# Patient Record
Sex: Male | Born: 2005 | Race: Black or African American | Hispanic: No | Marital: Single | Smoking: Never smoker
Health system: Southern US, Community
[De-identification: ages and names within clinical notes are randomized; demographics above are authoritative.]

## PROBLEM LIST (undated history)

## (undated) ENCOUNTER — Emergency Department (HOSPITAL_COMMUNITY): Payer: Medicaid Other

## (undated) DIAGNOSIS — F909 Attention-deficit hyperactivity disorder, unspecified type: Secondary | ICD-10-CM

## (undated) HISTORY — PX: TONSILLECTOMY: SUR1361

## (undated) HISTORY — PX: NO PAST SURGERIES: SHX2092

## (undated) HISTORY — PX: ADENOIDECTOMY: SUR15

---
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
## 2023-01-12 DIAGNOSIS — Z13220 Encounter for screening for lipoid disorders: Secondary | ICD-10-CM | POA: Diagnosis not present

## 2023-01-12 DIAGNOSIS — Z0000 Encounter for general adult medical examination without abnormal findings: Secondary | ICD-10-CM | POA: Diagnosis not present

## 2023-07-19 ENCOUNTER — Ambulatory Visit (HOSPITAL_COMMUNITY)
Admission: EM | Admit: 2023-07-19 | Discharge: 2023-07-19 | Disposition: A | Discharge status: Home / Self Care | Attending: Emergency Medicine | Admitting: Emergency Medicine

## 2023-07-19 ENCOUNTER — Encounter (HOSPITAL_COMMUNITY): Payer: Self-pay

## 2023-07-19 DIAGNOSIS — S0501XA Injury of conjunctiva and corneal abrasion without foreign body, right eye, initial encounter: Secondary | ICD-10-CM

## 2023-07-19 LAB — POCT FASTING CBG KUC MANUAL ENTRY: POCT Glucose (KUC): 94 mg/dL (ref 70–99)

## 2023-07-19 MED ORDER — TETRACAINE HCL 0.5 % OP SOLN
OPHTHALMIC | Status: AC
  Filled 2023-07-19: qty 4

## 2023-07-19 MED ORDER — ERYTHROMYCIN 5 MG/GM OP OINT: TOPICAL_OINTMENT | OPHTHALMIC | 0 refills | Status: AC

## 2023-07-19 MED ORDER — FLUORESCEIN SODIUM 1 MG OP STRP
ORAL_STRIP | OPHTHALMIC | Status: AC
  Filled 2023-07-19: qty 1

## 2023-07-19 NOTE — ED Triage Notes (Signed)
 Patient here today with c/o right eye pain after a dog scratching him in the eye 2 days ago. Patient states that he has some visual changes. Mom is also concerned with his glucose levels he has had higher glucose levels in the past.

## 2023-07-19 NOTE — ED Provider Notes (Signed)
 [facility]    CSN: [id number] Arrival date & time: 07/19/23  1707      History   Chief Complaint Chief Complaint  Patient presents with   Eye Pain    HPI Seth Farmer is a 18 y.o. male.   Patient presents to clinic with mother for concerns of eye pain after he got poked in the eye by his dogs paw 2 days ago.  Yesterday his eye was a lot more swollen and red.  He presents to clinic today over concern of eye redness and pain.  Has had some visual changes, no vision loss.  Discomfort with blinking.  Mother would also like his glucose levels checked as he has had prediabetic A1c in the past.  He is not fasting.  The history is provided by the patient, medical records and a parent.  Eye Pain    Past Medical History:  Diagnosis Date   ADHD     Patient Active Problem List   Diagnosis Date Noted   Postural low back pain 07/30/2020   Seasonal and perennial allergic rhinitis 11/12/2017   Adverse food reaction 11/12/2017    Past Surgical History:  Procedure Laterality Date   ADENOIDECTOMY     DENTAL SURGERY  08/09/2019   NO PAST SURGERIES     TONSILLECTOMY         Home Medications    Prior to Admission medications   Medication Sig Start Date End Date Taking? Authorizing Provider  erythromycin ophthalmic ointment Place a 1/2 inch ribbon of ointment into the right lower eyelid 4x daily for 5 days 07/19/23  Yes Rinaldo Ratel, [place] N, FNP  cetirizine (ZYRTEC) 10 MG tablet Take 1 tablet (10 mg total) by mouth daily. 10/03/21   Alfonse Spruce, MD  EPINEPHrine (EPIPEN 2-PAK) 0.3 mg/0.3 mL IJ SOAJ injection Inject 0.3 mg into the muscle as needed for anaphylaxis. 10/03/21   Alfonse Spruce, MD  fluticasone [facility]) 50 MCG/ACT nasal spray Place 2 sprays into both nostrils daily. 10/03/21   Alfonse Spruce, MD  montelukast (SINGULAIR) 10 MG tablet Take 1 tablet (10 mg total) by mouth at bedtime. 10/03/21   Alfonse Spruce, MD    Family  History Family History  Problem Relation Age of Onset   Allergic rhinitis Mother    Allergic rhinitis Brother     Social History Social History   Tobacco Use   Smoking status: Never   Smokeless tobacco: Never  Vaping Use   Vaping status: Never Used  Substance Use Topics   Alcohol use: No   Drug use: Never     Allergies   Patient has no known allergies.   Review of Systems Review of Systems  Per HPI  Physical Exam Triage Vital Signs ED Triage Vitals  Encounter Vitals Group     BP 07/19/23 1811 126/76     Systolic BP Percentile --      Diastolic BP Percentile --      Pulse Rate 07/19/23 1811 66     Resp 07/19/23 1811 16     Temp 07/19/23 1811 98.7 F (37.1 C)     Temp Source 07/19/23 1811 Oral     SpO2 07/19/23 1811 98 %     Weight 07/19/23 1811 150 lb (68 kg)     Height 07/19/23 1811 5\' 8"  (1.727 m)     Head Circumference --      Peak Flow --      Pain Score 07/19/23 1812  8     Pain Loc --      Pain Education --      Exclude from Growth Chart --    No data found.  Updated Vital Signs BP 126/76 (BP Location: Left Arm)   Pulse 66   Temp 98.7 F (37.1 C) (Oral)   Resp 16   Ht 5\' 8"  (1.727 m)   Wt 150 lb (68 kg)   SpO2 98%   BMI 22.81 kg/m   Visual Acuity Right Eye Distance: 20/40 Left Eye Distance: 20/40 Bilateral Distance: 20/25  Right Eye Near:   Left Eye Near:    Bilateral Near:     Physical Exam Vitals and nursing note reviewed.  Constitutional:      Appearance: Normal appearance.  HENT:     Head: Normocephalic and atraumatic.     Right Ear: External ear normal.     Left Ear: External ear normal.     Nose: Nose normal.     Mouth/Throat:     Mouth: Mucous membranes are moist.  Eyes:     General: Lids are normal. Lids are everted, no foreign bodies appreciated. Vision grossly intact. Gaze aligned appropriately.     Extraocular Movements: Extraocular movements intact.     Pupils: Pupils are equal, round, and reactive to light.      Right eye: Corneal abrasion present.      Comments: Corneal abrasion to the right lower cornea noted on fluorescein stain  Cardiovascular:     Rate and Rhythm: Normal rate.  Pulmonary:     Effort: Pulmonary effort is normal. No respiratory distress.  Musculoskeletal:        General: Normal range of motion.  Skin:    General: Skin is warm and dry.  Neurological:     General: No focal deficit present.     Mental Status: He is alert.  Psychiatric:        Mood and Affect: Mood normal.        Behavior: Behavior is cooperative.      UC Treatments / Results  Labs (all labs ordered are listed, but only abnormal results are displayed) Labs Reviewed  POCT FASTING CBG [facility] MANUAL ENTRY    EKG   Radiology No results found.  Procedures Procedures (including critical care time)  Medications Ordered in UC Medications - No data to display  Initial Impression / Assessment and Plan / UC Course  I have reviewed the triage vital signs and the nursing notes.  Pertinent labs & imaging results that were available during my care of the patient were reviewed by me and considered in my medical decision making (see chart for details).  Vitals and triage reviewed, patient is hemodynamically stable.  Corneal abrasion to right lower cornea on fluorescein stain.  Vision grossly intact.  Will send in erythromycin ointment to help prevent against bacterial infection due to injury with dog paw.  Mother requested CBG, this was normal.  Plan of care, follow-up care return precautions given, no questions at this time.     Final Clinical Impressions(s) / UC Diagnoses   Final diagnoses:  Abrasion of right cornea, initial encounter     Discharge Instructions      You can numbing drops every 30 minutes to an hour for the rest of tonight.  Discard them when you go to bed.  Use the erythromycin 4 times daily for the next 5 days to help prevent bacterial infection.  This will also coat and soothe the  eye.  His blood sugar was 94, this is normal.  For any pain you can take 800 mg of ibuprofen every 8 hours as needed.     ED Prescriptions     Medication Sig Dispense Auth. Provider   erythromycin ophthalmic ointment Place a 1/2 inch ribbon of ointment into the right lower eyelid 4x daily for 5 days 3.5 g Shaterria Sager, [place], [place]      PDMP not reviewed this encounter.   Waneda Klammer, [place] N, [place] 07/19/23 [phone]

## 2023-07-19 NOTE — Discharge Instructions (Addendum)
 You can numbing drops every 30 minutes to an hour for the rest of tonight.  Discard them when you go to bed.  Use the erythromycin 4 times daily for the next 5 days to help prevent bacterial infection.  This will also coat and soothe the eye.  His blood sugar was 94, this is normal.  For any pain you can take 800 mg of ibuprofen every 8 hours as needed.

## 2023-08-27 ENCOUNTER — Encounter: Payer: Self-pay | Admitting: Family Medicine

## 2023-08-27 ENCOUNTER — Ambulatory Visit (INDEPENDENT_AMBULATORY_CARE_PROVIDER_SITE_OTHER): Admitting: Family Medicine

## 2023-08-27 VITALS — BP 100/60 | HR 77 | Temp 98.4°F | Resp 12 | Ht 66.93 in | Wt 148.1 lb

## 2023-08-27 DIAGNOSIS — H1010 Acute atopic conjunctivitis, unspecified eye: Secondary | ICD-10-CM | POA: Insufficient documentation

## 2023-08-27 DIAGNOSIS — H1013 Acute atopic conjunctivitis, bilateral: Secondary | ICD-10-CM

## 2023-08-27 DIAGNOSIS — T7800XD Anaphylactic reaction due to unspecified food, subsequent encounter: Secondary | ICD-10-CM

## 2023-08-27 DIAGNOSIS — J3089 Other allergic rhinitis: Secondary | ICD-10-CM | POA: Diagnosis not present

## 2023-08-27 DIAGNOSIS — J302 Other seasonal allergic rhinitis: Secondary | ICD-10-CM

## 2023-08-27 DIAGNOSIS — T7800XA Anaphylactic reaction due to unspecified food, initial encounter: Secondary | ICD-10-CM | POA: Insufficient documentation

## 2023-08-27 MED ORDER — FLUTICASONE PROPIONATE 50 MCG/ACT NA SUSP: 2.0000 | Freq: Every day | NASAL | 5 refills | Status: AC | PRN

## 2023-08-27 MED ORDER — EPINEPHRINE 0.3 MG/0.3ML IJ SOAJ: 0.3000 mg | INTRAMUSCULAR | 2 refills | Status: AC | PRN

## 2023-08-27 MED ORDER — CROMOLYN SODIUM 4 % OP SOLN: 2.0000 [drp] | Freq: Four times a day (QID) | OPHTHALMIC | 5 refills | Status: AC | PRN

## 2023-08-27 MED ORDER — MONTELUKAST SODIUM 10 MG PO TABS: 10.0000 mg | ORAL_TABLET | Freq: Every day | ORAL | 11 refills | Status: AC

## 2023-08-27 MED ORDER — CETIRIZINE HCL 10 MG PO TABS: 10.0000 mg | ORAL_TABLET | Freq: Every day | ORAL | 11 refills | Status: AC

## 2023-08-27 NOTE — Progress Notes (Signed)
 [address] [place] [address] Dept: [phone]  FOLLOW UP NOTE  Patient ID: Seth Farmer, male    DOB: 18-Oct-2005  Age: 18 y.o. MRN: [id number] Date of Office Visit: 08/27/2023  Assessment  Chief Complaint: Medication Refill  HPI Seth Farmer is a 18 year old male who presents to the clinic for a follow-up visit.  He was last seen in this clinic on 12/05/2021 by Dr. 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  He was able to pass a peanut challenge on 12/05/2021.   At today's visit, he reports his allergic rhinitis has been poorly controlled with symptoms including clear rhinorrhea, sneezing, postnasal drainage, and nasal congestion.  He is not currently taking an antihistamine or using a steroid nasal spray.  He has previously taken montelukast without adverse effects, however, he is currently out of this medication.  His last environmental allergy skin testing was on 11/12/2017 was positive to tree pollen, weed pollen, grass pollen, mold, dust mite, and cat.    Allergic conjunctivitis is reported as poorly controlled with symptoms including red and itchy eyes for which he is not currently using an allergy eyedrop or lubricating eyedrop.   He continues to avoid tree nuts, watermelon and carrots with no accidental ingestion or EpiPen use since his last visit to this clinic.  His EpiPen is currently out of date and will be reordered at today's visit.  He reports that he is currently eating peanuts, wheat, soy, and milk without adverse reaction.  His current medications are listed in the chart.  Drug Allergies:  No Known Allergies  Physical Exam: BP 100/60   Pulse 77   Temp 98.4 F (36.9 C)   Resp 12   Ht 5' 6.93" (1.7 m)   Wt 148 lb 1.6 oz (67.2 kg)   SpO2 98%   BMI 23.24 kg/m    Physical Exam Vitals reviewed.  Constitutional:       Appearance: Normal appearance.  HENT:     Head: Normocephalic and atraumatic.     Right Ear: Tympanic membrane normal.     Left Ear: Tympanic membrane normal.     Nose:     Comments: Bilateral nares edematous and pale with thin clear nasal drainage noted.  Pharynx normal.  Ears normal.  Eyes normal.    Mouth/Throat:     Pharynx: Oropharynx is clear.  Eyes:     Conjunctiva/sclera: Conjunctivae normal.  Cardiovascular:     Rate and Rhythm: Normal rate and regular rhythm.     Heart sounds: Normal heart sounds. No murmur heard. Pulmonary:     Effort: Pulmonary effort is normal.     Breath sounds: Normal breath sounds.     Comments: Lungs clear to auscultation Musculoskeletal:     Cervical back: Normal range of motion and neck supple.  Skin:    General: Skin is warm and dry.  Neurological:     Mental Status: He is alert and oriented to person, place, and time.  Psychiatric:        Mood and Affect: Mood normal.        Behavior: Behavior normal.        Thought Content: Thought content normal.        Judgment: Judgment normal.     Assessment and Plan: 1. Anaphylactic shock due to food, subsequent encounter   2. Seasonal allergic conjunctivitis  3. Seasonal and perennial allergic rhinitis     Meds ordered this encounter  Medications   cetirizine (ZYRTEC) 10 MG tablet    Sig: Take 1 tablet (10 mg total) by mouth daily.    Dispense:  30 tablet    Refill:  11   EPINEPHrine (EPIPEN 2-PAK) 0.3 mg/0.3 mL IJ SOAJ injection    Sig: Inject 0.3 mg into the muscle as needed for anaphylaxis.    Dispense:  2 each    Refill:  2   montelukast (SINGULAIR) 10 MG tablet    Sig: Take 1 tablet (10 mg total) by mouth at bedtime.    Dispense:  30 tablet    Refill:  11   fluticasone (FLONASE) 50 MCG/ACT nasal spray    Sig: Place 2 sprays into both nostrils daily as needed for allergies or rhinitis.    Dispense:  16 g    Refill:  5   cromolyn (OPTICROM) 4 % ophthalmic solution    Sig:  Place 2 drops into both eyes 4 (four) times daily as needed.    Dispense:  10 mL    Refill:  5    Patient Instructions  Allergic rhinitis Continue allergen avoidance measures directed toward grass pollen, weed pollen, tree pollen, mold, dust mite, and the cat as listed below Begin cetirizine 10 mg once a day if needed for runny nose or itch Restart montelukast 10 mg once a day to prevent allergy symptoms continue Xyzal 5 mg once a day if needed for runny nose or itch Begin Flonase 2 sprays in each nostril once a day if needed for a stuffy nose Consider saline nasal rinses as needed for nasal symptoms. Use this before any medicated nasal sprays for best result Consider allergen immunotherapy if your symptoms are well-controlled with the treatment plan as listed above Consider updating your environmental allergy testing.  Remember to stop antihistamines for 3 days before your testing appointment.  Allergic conjunctivitis Begin cromolyn 2 drops in each eye up to 4 times a day if needed for red or itchy eyes  Food allergy Continue to avoid tree nuts, watermelon, and carrot in case of an allergic reaction, take Benadryl 50 mg every 4 hours, and if life-threatening symptoms occur, inject with EpiPen 0.3 mg.   Consider updating your food allergy skin testing.  Remember to stop antihistamines for 3 days before your skin testing appointment  Call the clinic if this treatment plan is not working well for you.  Follow up in 6 months or sooner if needed.   Return in about 6 months (around 02/26/2024), or if symptoms worsen or fail to improve.    Thank you for the opportunity to care for this patient.  Please do not hesitate to contact me with questions.  Thermon Leyland, FNP [facility]

## 2023-08-27 NOTE — Patient Instructions (Addendum)
 Allergic rhinitis Continue allergen avoidance measures directed toward grass pollen, weed pollen, tree pollen, mold, dust mite, and the cat as listed below Begin cetirizine 10 mg once a day if needed for runny nose or itch Restart montelukast 10 mg once a day to prevent allergy symptoms continue Xyzal 5 mg once a day if needed for runny nose or itch Begin Flonase 2 sprays in each nostril once a day if needed for a stuffy nose Consider saline nasal rinses as needed for nasal symptoms. Use this before any medicated nasal sprays for best result Consider allergen immunotherapy if your symptoms are well-controlled with the treatment plan as listed above Consider updating your environmental allergy testing.  Remember to stop antihistamines for 3 days before your testing appointment.  Allergic conjunctivitis Begin cromolyn 2 drops in each eye up to 4 times a day if needed for red or itchy eyes  Food allergy Continue to avoid tree nuts, watermelon, and carrot in case of an allergic reaction, take Benadryl 50 mg every 4 hours, and if life-threatening symptoms occur, inject with EpiPen 0.3 mg.   Consider updating your food allergy skin testing.  Remember to stop antihistamines for 3 days before your skin testing appointment  Call the clinic if this treatment plan is not working well for you.  Follow up in 6 months or sooner if needed.  Reducing Pollen Exposure The American Academy of Allergy, Asthma and Immunology suggests the following steps to reduce your exposure to pollen during allergy seasons. Do not hang sheets or clothing out to dry; pollen may collect on these items. Do not mow lawns or spend time around freshly cut grass; mowing stirs up pollen. Keep windows closed at night.  Keep car windows closed while driving. Minimize morning activities outdoors, a time when pollen counts are usually at their highest. Stay indoors as much as possible when pollen counts or humidity is high and on windy  days when pollen tends to remain in the air longer. Use air conditioning when possible.  Many air conditioners have filters that trap the pollen spores. Use a HEPA room air filter to remove pollen form the indoor air you breathe.  Control of Mold Allergen Mold and fungi can grow on a variety of surfaces provided certain temperature and moisture conditions exist.  Outdoor molds grow on plants, decaying vegetation and soil.  The major outdoor mold, Alternaria and Cladosporium, are found in very high numbers during hot and dry conditions.  Generally, a late Summer - Fall peak is seen for common outdoor fungal spores.  Rain will temporarily lower outdoor mold spore count, but counts rise rapidly when the rainy period ends.  The most important indoor molds are Aspergillus and Penicillium.  Dark, humid and poorly ventilated basements are ideal sites for mold growth.  The next most common sites of mold growth are the bathroom and the kitchen.  Outdoor [employer] Use air conditioning and keep windows closed Avoid exposure to decaying vegetation. Avoid leaf raking. Avoid grain handling. Consider wearing a face mask if working in moldy areas.  Indoor Mold Control Maintain humidity below 50%. Clean washable surfaces with 5% bleach solution. Remove sources e.g. Contaminated carpets.   Control of Dust Mite Allergen Dust mites play a major role in allergic asthma and rhinitis. They occur in environments with high humidity wherever human skin is found. Dust mites absorb humidity from the atmosphere (ie, they do not drink) and feed on organic matter (including shed human and animal skin). Dust  mites are a microscopic type of insect that you cannot see with the naked eye. High levels of dust mites have been detected from mattresses, pillows, carpets, upholstered furniture, bed covers, clothes, soft toys and any woven material. The principal allergen of the dust mite is found in its feces. A gram of dust may  contain 1,000 mites and 250,000 fecal particles. Mite antigen is easily measured in the air during house cleaning activities. Dust mites do not bite and do not cause harm to humans, other than by triggering allergies/asthma.  Ways to decrease your exposure to dust mites in your home:  1. Encase mattresses, box springs and pillows with a mite-impermeable barrier or cover  2. Wash sheets, blankets and drapes weekly in hot water (130 F) with detergent and dry them in a dryer on the hot setting.  3. Have the room cleaned frequently with a vacuum cleaner and a damp dust-mop. For carpeting or rugs, vacuuming with a vacuum cleaner equipped with a high-efficiency particulate air (HEPA) filter. The dust mite allergic individual should not be in a room which is being cleaned and should wait 1 hour after cleaning before going into the room.  4. Do not sleep on upholstered furniture (eg, couches).  5. If possible removing carpeting, upholstered furniture and drapery from the home is ideal. Horizontal blinds should be eliminated in the rooms where the person spends the most time (bedroom, study, television room). Washable vinyl, roller-type shades are optimal.  6. Remove all non-washable stuffed toys from the bedroom. Wash stuffed toys weekly like sheets and blankets above.  7. Reduce indoor humidity to less than 50%. Inexpensive humidity monitors can be purchased at most hardware stores. Do not use a humidifier as can make the problem worse and are not recommended.  Control of Dog or Cat Allergen Avoidance is the best way to manage a dog or cat allergy. If you have a dog or cat and are allergic to dog or cats, consider removing the dog or cat from the home. If you have a dog or cat but don't want to find it a new home, or if your family wants a pet even though someone in the household is allergic, here are some strategies that may help keep symptoms at bay:  Keep the pet out of your bedroom and restrict  it to only a few rooms. Be advised that keeping the dog or cat in only one room will not limit the allergens to that room. Don't pet, hug or kiss the dog or cat; if you do, wash your hands with soap and water. High-efficiency particulate air (HEPA) cleaners run continuously in a bedroom or living room can reduce allergen levels over time. Regular use of a high-efficiency vacuum cleaner or a central vacuum can reduce allergen levels. Giving your dog or cat a bath at least once a week can reduce airborne allergen.

## 2023-09-22 NOTE — Patient Instructions (Incomplete)
 Allergic rhinitis Your skin testing Continue allergen avoidance measures directed toward grass pollen, weed pollen, tree pollen, mold, dust mite, and the cat as listed below Begin cetirizine  10 mg once a day if needed for runny nose or itch Restart montelukast  10 mg once a day to prevent allergy symptoms continue Xyzal 5 mg once a day if needed for runny nose or itch Begin Flonase  2 sprays in each nostril once a day if needed for a stuffy nose Consider saline nasal rinses as needed for nasal symptoms. Use this before any medicated nasal sprays for best result Consider allergen immunotherapy if your symptoms are well-controlled with the treatment plan as listed above Consider updating your environmental allergy testing.  Remember to stop antihistamines for 3 days before your testing appointment.  Allergic conjunctivitis Begin cromolyn  2 drops in each eye up to 4 times a day if needed for red or itchy eyes  Food allergy Continue to avoid tree nuts, watermelon, and carrot in case of an allergic reaction, take Benadryl 50 mg every 4 hours, and if life-threatening symptoms occur, inject with EpiPen  0.3 mg.   Consider updating your food allergy skin testing.  Remember to stop antihistamines for 3 days before your skin testing appointment  Call the clinic if this treatment plan is not working well for you.  Follow up in 6 months or sooner if needed.  Reducing Pollen Exposure The American Academy of Allergy, Asthma and Immunology suggests the following steps to reduce your exposure to pollen during allergy seasons. Do not hang sheets or clothing out to dry; pollen may collect on these items. Do not mow lawns or spend time around freshly cut grass; mowing stirs up pollen. Keep windows closed at night.  Keep car windows closed while driving. Minimize morning activities outdoors, a time when pollen counts are usually at their highest. Stay indoors as much as possible when pollen counts or humidity is  high and on windy days when pollen tends to remain in the air longer. Use air conditioning when possible.  Many air conditioners have filters that trap the pollen spores. Use a HEPA room air filter to remove pollen form the indoor air you breathe.  Control of Mold Allergen Mold and fungi can grow on a variety of surfaces provided certain temperature and moisture conditions exist.  Outdoor molds grow on plants, decaying vegetation and soil.  The major outdoor mold, Alternaria and Cladosporium, are found in very high numbers during hot and dry conditions.  Generally, a late Summer - Fall peak is seen for common outdoor fungal spores.  Rain will temporarily lower outdoor mold spore count, but counts rise rapidly when the rainy period ends.  The most important indoor molds are Aspergillus and Penicillium.  Dark, humid and poorly ventilated basements are ideal sites for mold growth.  The next most common sites of mold growth are the bathroom and the kitchen.  Outdoor [employer] Use air conditioning and keep windows closed Avoid exposure to decaying vegetation. Avoid leaf raking. Avoid grain handling. Consider wearing a face mask if working in moldy areas.  Indoor Mold Control Maintain humidity below 50%. Clean washable surfaces with 5% bleach solution. Remove sources e.g. Contaminated carpets.   Control of Dust Mite Allergen Dust mites play a major role in allergic asthma and rhinitis. They occur in environments with high humidity wherever human skin is found. Dust mites absorb humidity from the atmosphere (ie, they do not drink) and feed on organic matter (including shed human and  animal skin). Dust mites are a microscopic type of insect that you cannot see with the naked eye. High levels of dust mites have been detected from mattresses, pillows, carpets, upholstered furniture, bed covers, clothes, soft toys and any woven material. The principal allergen of the dust mite is found in its feces. A  gram of dust may contain 1,000 mites and 250,000 fecal particles. Mite antigen is easily measured in the air during house cleaning activities. Dust mites do not bite and do not cause harm to humans, other than by triggering allergies/asthma.  Ways to decrease your exposure to dust mites in your home:  1. Encase mattresses, box springs and pillows with a mite-impermeable barrier or cover  2. Wash sheets, blankets and drapes weekly in hot water (130 F) with detergent and dry them in a dryer on the hot setting.  3. Have the room cleaned frequently with a vacuum cleaner and a damp dust-mop. For carpeting or rugs, vacuuming with a vacuum cleaner equipped with a high-efficiency particulate air (HEPA) filter. The dust mite allergic individual should not be in a room which is being cleaned and should wait 1 hour after cleaning before going into the room.  4. Do not sleep on upholstered furniture (eg, couches).  5. If possible removing carpeting, upholstered furniture and drapery from the home is ideal. Horizontal blinds should be eliminated in the rooms where the person spends the most time (bedroom, study, television room). Washable vinyl, roller-type shades are optimal.  6. Remove all non-washable stuffed toys from the bedroom. Wash stuffed toys weekly like sheets and blankets above.  7. Reduce indoor humidity to less than 50%. Inexpensive humidity monitors can be purchased at most hardware stores. Do not use a humidifier as can make the problem worse and are not recommended.  Control of Dog or Cat Allergen Avoidance is the best way to manage a dog or cat allergy. If you have a dog or cat and are allergic to dog or cats, consider removing the dog or cat from the home. If you have a dog or cat but don't want to find it a new home, or if your family wants a pet even though someone in the household is allergic, here are some strategies that may help keep symptoms at bay:  Keep the pet out of your  bedroom and restrict it to only a few rooms. Be advised that keeping the dog or cat in only one room will not limit the allergens to that room. Don't pet, hug or kiss the dog or cat; if you do, wash your hands with soap and water. High-efficiency particulate air (HEPA) cleaners run continuously in a bedroom or living room can reduce allergen levels over time. Regular use of a high-efficiency vacuum cleaner or a central vacuum can reduce allergen levels. Giving your dog or cat a bath at least once a week can reduce airborne allergen.

## 2023-09-22 NOTE — Progress Notes (Signed)
   [address] [place] [address] Dept: [phone]  FOLLOW UP NOTE  Patient ID: Seth Farmer, male    DOB: February 03, 2006  Age: 18 y.o. MRN: [id number] Date of Office Visit: 09/24/2023  Assessment  Chief Complaint: No chief complaint on file.  HPI Seth Farmer is a 18 year old male who presents to the clinic for follow-up visit.  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 His last environmental allergy skin testing was on 11/12/2017 was positive to tree pollen, weed pollen, grass pollen, mold, dust mite, and cat.   Discussed the use of AI scribe software for clinical note transcription with the patient, who gave verbal consent to proceed.  History of Present Illness      Drug Allergies:  No Known Allergies  Physical Exam: There were no vitals taken for this visit.   Physical Exam  Diagnostics:    Assessment and Plan: No diagnosis found.  No orders of the defined types were placed in this encounter.   There are no Patient Instructions on file for this visit.  No follow-ups on file.    Thank you for the opportunity to care for this patient.  Please do not hesitate to contact me with questions.  Marinus Sic, FNP [facility]

## 2023-09-24 ENCOUNTER — Ambulatory Visit: Admitting: Family Medicine

## 2023-09-24 ENCOUNTER — Encounter: Payer: Self-pay | Admitting: Family Medicine

## 2023-09-24 DIAGNOSIS — H1010 Acute atopic conjunctivitis, unspecified eye: Secondary | ICD-10-CM

## 2023-09-27 NOTE — Patient Instructions (Incomplete)
 Allergic rhinitis Your environmental allergy skin testing was positive to grass pollen, weed pollen, tree pollen, molds, dust mite, cat hair, and horse.   Allergen avoidance measures are listed below Continue cetirizine  10 mg once a day if needed for runny nose or itch Continue montelukast  10 mg once a day to prevent allergy symptoms Continue Xyzal 5 mg once a day if needed for runny nose or itch Continue Flonase  2 sprays in each nostril once a day if needed for a stuffy nose Consider saline nasal rinses as needed for nasal symptoms. Use this before any medicated nasal sprays for best result Consider allergen immunotherapy if your symptoms are well-controlled with the treatment plan as listed above Take 2 tablets of prednisone today, then stop. You may take an additional dose of antihistamine today if needed for itch  Allergic conjunctivitis Continue cromolyn  2 drops in each eye up to 4 times a day if needed for red or itchy eyes  Food allergy Your food allergy skin testing was positive to almond and carrot and borderline positive to coconut and watermelon. Continue to avoid tree nuts, coconut, carrot, and watermelon.  In case of an allergic reaction, take Benadryl 50 mg every 4 hours, and if life-threatening symptoms occur, inject with EpiPen  0.3 mg.   Lab orders have been entered to help us  evaluate your food allergy.  We will call you when that results become available  Call the clinic if this treatment plan is not working well for you.  Follow up in 6 months or sooner if needed.  Reducing Pollen Exposure The American Academy of Allergy, Asthma and Immunology suggests the following steps to reduce your exposure to pollen during allergy seasons. Do not hang sheets or clothing out to dry; pollen may collect on these items. Do not mow lawns or spend time around freshly cut grass; mowing stirs up pollen. Keep windows closed at night.  Keep car windows closed while driving. Minimize morning  activities outdoors, a time when pollen counts are usually at their highest. Stay indoors as much as possible when pollen counts or humidity is high and on windy days when pollen tends to remain in the air longer. Use air conditioning when possible.  Many air conditioners have filters that trap the pollen spores. Use a HEPA room air filter to remove pollen form the indoor air you breathe.  Control of Mold Allergen Mold and fungi can grow on a variety of surfaces provided certain temperature and moisture conditions exist.  Outdoor molds grow on plants, decaying vegetation and soil.  The major outdoor mold, Alternaria and Cladosporium, are found in very high numbers during hot and dry conditions.  Generally, a late Summer - Fall peak is seen for common outdoor fungal spores.  Rain will temporarily lower outdoor mold spore count, but counts rise rapidly when the rainy period ends.  The most important indoor molds are Aspergillus and Penicillium.  Dark, humid and poorly ventilated basements are ideal sites for mold growth.  The next most common sites of mold growth are the bathroom and the kitchen.  Outdoor [employer] Use air conditioning and keep windows closed Avoid exposure to decaying vegetation. Avoid leaf raking. Avoid grain handling. Consider wearing a face mask if working in moldy areas.  Indoor Mold Control Maintain humidity below 50%. Clean washable surfaces with 5% bleach solution. Remove sources e.g. Contaminated carpets.   Control of Dust Mite Allergen Dust mites play a major role in allergic asthma and rhinitis. They occur in  environments with high humidity wherever human skin is found. Dust mites absorb humidity from the atmosphere (ie, they do not drink) and feed on organic matter (including shed human and animal skin). Dust mites are a microscopic type of insect that you cannot see with the naked eye. High levels of dust mites have been detected from mattresses, pillows, carpets,  upholstered furniture, bed covers, clothes, soft toys and any woven material. The principal allergen of the dust mite is found in its feces. A gram of dust may contain 1,000 mites and 250,000 fecal particles. Mite antigen is easily measured in the air during house cleaning activities. Dust mites do not bite and do not cause harm to humans, other than by triggering allergies/asthma.  Ways to decrease your exposure to dust mites in your home:  1. Encase mattresses, box springs and pillows with a mite-impermeable barrier or cover  2. Wash sheets, blankets and drapes weekly in hot water (130 F) with detergent and dry them in a dryer on the hot setting.  3. Have the room cleaned frequently with a vacuum cleaner and a damp dust-mop. For carpeting or rugs, vacuuming with a vacuum cleaner equipped with a high-efficiency particulate air (HEPA) filter. The dust mite allergic individual should not be in a room which is being cleaned and should wait 1 hour after cleaning before going into the room.  4. Do not sleep on upholstered furniture (eg, couches).  5. If possible removing carpeting, upholstered furniture and drapery from the home is ideal. Horizontal blinds should be eliminated in the rooms where the person spends the most time (bedroom, study, television room). Washable vinyl, roller-type shades are optimal.  6. Remove all non-washable stuffed toys from the bedroom. Wash stuffed toys weekly like sheets and blankets above.  7. Reduce indoor humidity to less than 50%. Inexpensive humidity monitors can be purchased at most hardware stores. Do not use a humidifier as can make the problem worse and are not recommended.  Control of Dog or Cat Allergen Avoidance is the best way to manage a dog or cat allergy. If you have a dog or cat and are allergic to dog or cats, consider removing the dog or cat from the home. If you have a dog or cat but don't want to find it a new home, or if your family wants a pet  even though someone in the household is allergic, here are some strategies that may help keep symptoms at bay:  Keep the pet out of your bedroom and restrict it to only a few rooms. Be advised that keeping the dog or cat in only one room will not limit the allergens to that room. Don't pet, hug or kiss the dog or cat; if you do, wash your hands with soap and water. High-efficiency particulate air (HEPA) cleaners run continuously in a bedroom or living room can reduce allergen levels over time. Regular use of a high-efficiency vacuum cleaner or a central vacuum can reduce allergen levels. Giving your dog or cat a bath at least once a week can reduce airborne allergen.

## 2023-09-27 NOTE — Progress Notes (Unsigned)
   [address] [place] [address] Dept: [phone]  FOLLOW UP NOTE  Patient ID: Seth Farmer, male    DOB: [birth year]  Age: 18 y.o. MRN: [id number] Date of Office Visit: 09/28/2023  Assessment  Chief Complaint: No chief complaint on file.  HPI Seth Farmer is a 18 year old male who presents to the clinic for follow-up visit.  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 His last environmental allergy skin testing was on 11/12/2017 was positive to tree pollen, weed pollen, grass pollen, mold, dust mite, and cat.    Discussed the use of AI scribe software for clinical note transcription with the patient, who gave verbal consent to proceed.  History of Present Illness      Drug Allergies:  No Known Allergies  Physical Exam: There were no vitals taken for this visit.   Physical Exam  Diagnostics:    Assessment and Plan: No diagnosis found.  No orders of the defined types were placed in this encounter.   There are no Patient Instructions on file for this visit.  No follow-ups on file.    Thank you for the opportunity to care for this patient.  Please do not hesitate to contact me with questions.  Marinus Sic, FNP [facility]

## 2023-09-28 ENCOUNTER — Ambulatory Visit (INDEPENDENT_AMBULATORY_CARE_PROVIDER_SITE_OTHER): Admitting: Family Medicine

## 2023-09-28 ENCOUNTER — Encounter: Payer: Self-pay | Admitting: Family Medicine

## 2023-09-28 DIAGNOSIS — T7800XD Anaphylactic reaction due to unspecified food, subsequent encounter: Secondary | ICD-10-CM | POA: Diagnosis not present

## 2023-09-28 DIAGNOSIS — H1013 Acute atopic conjunctivitis, bilateral: Secondary | ICD-10-CM

## 2023-09-28 DIAGNOSIS — J302 Other seasonal allergic rhinitis: Secondary | ICD-10-CM

## 2023-09-28 DIAGNOSIS — J3089 Other allergic rhinitis: Secondary | ICD-10-CM | POA: Diagnosis not present

## 2023-09-28 DIAGNOSIS — T7800XA Anaphylactic reaction due to unspecified food, initial encounter: Secondary | ICD-10-CM

## 2023-09-28 DIAGNOSIS — H1010 Acute atopic conjunctivitis, unspecified eye: Secondary | ICD-10-CM

## 2023-10-01 ENCOUNTER — Other Ambulatory Visit: Payer: Self-pay | Admitting: Family Medicine

## 2023-10-01 DIAGNOSIS — T7800XA Anaphylactic reaction due to unspecified food, initial encounter: Secondary | ICD-10-CM

## 2023-10-01 LAB — ALLERGEN WATERMELON: Allergen Watermelon IgE: 0.17 kU/L — AB

## 2023-10-01 LAB — ALLERGENS(7)
Brazil Nut IgE: <0.1 kU/L
F020-IgE Almond: 0.37 kU/L — AB
F202-IgE Cashew Nut: <0.1 kU/L
Hazelnut (Filbert) IgE: 6.14 kU/L — AB
Peanut IgE: 0.69 kU/L — AB
Pecan Nut IgE: <0.1 kU/L
Walnut IgE: <0.1 kU/L

## 2023-10-01 LAB — ALLERGEN CARROT: Allergen Carrot IgE: 0.85 kU/L — AB

## 2023-10-06 ENCOUNTER — Ambulatory Visit: Payer: Self-pay | Admitting: Family Medicine

## 2023-10-06 NOTE — Progress Notes (Signed)
 Can you please let this patient know that the watermelon was low enough to offer a food challenge. Carrot remains elevated- avoid. Hazelnut elevated- avoid. Almond moderately elevated avoid. Please have him continue to avoid these foods for now and have access to an Epipen . Set up a challenge to watermelon if interested. Please ask if eating any tree nuts now. We may be able to add some of these into his diet if interested. Thank you

## 2023-10-19 LAB — IGE PEANUT COMPONENT PROFILE
F352-IgE Ara h 8: 4.72 kU/L — AB
F422-IgE Ara h 1: <0.1 kU/L
F423-IgE Ara h 2: <0.1 kU/L
F424-IgE Ara h 3: <0.1 kU/L
F427-IgE Ara h 9: 0.24 kU/L — AB
F447-IgE Ara h 6: <0.1 kU/L

## 2023-10-19 LAB — SPECIMEN STATUS REPORT

## 2023-12-30 NOTE — Progress Notes (Signed)
 [address] [place] [address] Dept: [phone]  FOLLOW UP NOTE  Patient ID: Seth Farmer, male    DOB: 08-03-2005  Age: 18 y.o. MRN: [id number] Date of Office Visit: 12/31/2023  Assessment  Chief Complaint: Allergic Rhinitis  (Medications have been helping. ) and Food Intolerance (Still avoiding food that he is allergic to. )  HPI Seth Farmer is a 18 year old male who presents to the clinic for a follow-up visit.  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    At today's visit, he reports allergic rhinitis has been well-controlled with no symptoms at this time including rhinorrhea, nasal congestion, sneezing, or postnasal drainage.  He continues cetirizine  10 mg once a day and is not currently using Flonase  or nasal saline rinses.  He reports that he is not currently taking montelukast .  His last environmental allergy  skin testing on 09/28/2023 was positive to grass pollen, weed pollen, tree pollen, mold, dust mite, cat, and horse.    Allergic conjunctivitis is reported as well-controlled with no symptoms including red or itchy eyes.  He is not currently using cromolyn  eyedrops.   He continues to avoid tree nuts, carrot, coconut, and watermelon.  We did discuss eligibility for a food challenge to watermelon, however, he reports that he does not like the taste of watermelon and is not interested in a food challenge at this time.  His last food allergy  skin testing on 09/28/2023 was positive to almond, carrot, coconut, and watermelon.  EpiPen  set is up-to-date.  His current medications are listed in the chart.  Drug Allergies:  Allergies  Allergen Reactions   Citrullus Vulgaris    Daucus Carota    Strawberry Extract    Tomato     Physical Exam: BP 118/64   Pulse 99   Temp 98.1 F (36.7 C)   Resp 18   SpO2 97%    Physical  Exam Vitals reviewed.  Constitutional:      Appearance: Normal appearance.  HENT:     Head: Normocephalic and atraumatic.     Right Ear: Tympanic membrane normal.     Left Ear: Tympanic membrane normal.     Nose:     Comments: Bilateral nares slightly erythematous with thin clear nasal drainage noted.  Pharynx normal.  Ears normal.  Eyes normal.    Mouth/Throat:     Pharynx: Oropharynx is clear.  Eyes:     Conjunctiva/sclera: Conjunctivae normal.  Cardiovascular:     Rate and Rhythm: Normal rate and regular rhythm.     Heart sounds: Normal heart sounds. No murmur heard. Pulmonary:     Effort: Pulmonary effort is normal.     Breath sounds: Normal breath sounds.     Comments: Lungs clear to auscultation Musculoskeletal:        General: Normal range of motion.     Cervical back: Normal range of motion and neck supple.  Skin:    General: Skin is warm and dry.  Neurological:     Mental Status: He is alert and oriented to person, place, and time.  Psychiatric:        Mood and Affect: Mood normal.        Behavior: Behavior normal.        Thought Content: Thought content normal.        Judgment: Judgment normal.     Assessment  and Plan: 1. Allergy  with anaphylaxis due to food   2. Seasonal and perennial allergic rhinitis   3. Seasonal allergic conjunctivitis     Patient Instructions  Allergic rhinitis Continue allergen avoidance measures directed toward grass pollen, weed pollen, tree pollen, molds, dust mite, cat hair, and horse.   Continue cetirizine  10 mg once a day if needed for runny nose or itch Continue Flonase  2 sprays in each nostril once a day if needed for a stuffy nose Consider saline nasal rinses as needed for nasal symptoms. Use this before any medicated nasal sprays for best result Consider allergen immunotherapy if your symptoms are well-controlled with the treatment plan as listed above  Allergic conjunctivitis Continue cromolyn  2 drops in each eye up to 4  times a day if needed for red or itchy eyes  Food allergy  Continue to avoid tree nuts, coconut, carrot, and watermelon.  In case of an allergic reaction, take cetirizine  10 mg every 12-24 hours, and if life-threatening symptoms occur, inject with EpiPen  0.3 mg.     Call the clinic if this treatment plan is not working well for you.  Follow up in 12 months or sooner if needed.   Return in about 1 year (around 12/30/2024), or if symptoms worsen or fail to improve.    Thank you for the opportunity to care for this patient.  Please do not hesitate to contact me with questions.  Arlean Mutter, FNP [facility] 

## 2023-12-30 NOTE — Patient Instructions (Addendum)
 Allergic rhinitis Continue allergen avoidance measures directed toward grass pollen, weed pollen, tree pollen, molds, dust mite, cat hair, and horse.   Continue cetirizine  10 mg once a day if needed for runny nose or itch Continue Flonase  2 sprays in each nostril once a day if needed for a stuffy nose Consider saline nasal rinses as needed for nasal symptoms. Use this before any medicated nasal sprays for best result Consider allergen immunotherapy if your symptoms are well-controlled with the treatment plan as listed above  Allergic conjunctivitis Continue cromolyn  2 drops in each eye up to 4 times a day if needed for red or itchy eyes  Food allergy  Continue to avoid tree nuts, coconut, carrot, and watermelon.  In case of an allergic reaction, take cetirizine  10 mg every 12-24 hours, and if life-threatening symptoms occur, inject with EpiPen  0.3 mg.     Call the clinic if this treatment plan is not working well for you.  Follow up in 12 months or sooner if needed.  Reducing Pollen Exposure The American Academy of All[employer] , Ast[employer] suggests the following steps to reduce your exposure to pollen during allergy  seasons. Do not hang sheets or clothing out to dry; pollen may collect on these items. Do not mow lawns or spend time around freshly cut grass; mowing stirs up pollen. Keep windows closed at night.  Keep car windows closed while driving. Minimize morning activities outdoors, a time when pollen counts are usually at their highest. Stay indoors as much as possible when pollen counts or humidity is high and on windy days when pollen tends to remain in the air longer. Use air conditioning when possible.  Many air conditioners have filters that trap the pollen spores. Use a HEPA room air filter to remove pollen form the indoor air you breathe.  Control of Mold Allergen Mold and fungi can grow on a variety of surfaces provided certain temperature and moisture conditions  exist.  Outdoor molds grow on plants, decaying vegetation and soil.  The major outdoor mold, Alternaria and Cladosporium, are found in very high numbers during hot and dry conditions.  Generally, a late Summer - Fall peak is seen for common outdoor fungal spores.  Rain will temporarily lower outdoor mold spore count, but counts rise rapidly when the rainy period ends.  The most important indoor molds are Aspergillus and Penicillium.  Dark, humid and poorly ventilated basements are ideal sites for mold growth.  The next most common sites of mold growth are the bathroom and the kitchen.  Outdoor [employer] Use air conditioning and keep windows closed Avoid exposure to decaying vegetation. Avoid leaf raking. Avoid grain handling. Consider wearing a face mask if working in moldy areas.  Indoor Mold Control Maintain humidity below 50%. Clean washable surfaces with 5% bleach solution. Remove sources e.g. Contaminated carpets.   Control of Dust Mite Allergen Dust mites play a major role in allergic asthma and rhinitis. They occur in environments with high humidity wherever human skin is found. Dust mites absorb humidity from the atmosphere (ie, they do not drink) and feed on organic matter (including shed human and animal skin). Dust mites are a microscopic type of insect that you cannot see with the naked eye. High levels of dust mites have been detected from mattresses, pillows, carpets, upholstered furniture, bed covers, clothes, soft toys and any woven material. The principal allergen of the dust mite is found in its feces. A gram of dust may contain 1,000 mites and 250,000  fecal particles. Mite antigen is easily measured in the air during house cleaning activities. Dust mites do not bite and do not cause harm to humans, other than by triggering allergies/asthma.  Ways to decrease your exposure to dust mites in your home:  1. Encase mattresses, box springs and pillows with a mite-impermeable  barrier or cover  2. Wash sheets, blankets and drapes weekly in hot water (130 F) with detergent and dry them in a dryer on the hot setting.  3. Have the room cleaned frequently with a vacuum cleaner and a damp dust-mop. For carpeting or rugs, vacuuming with a vacuum cleaner equipped with a high-efficiency particulate air (HEPA) filter. The dust mite allergic individual should not be in a room which is being cleaned and should wait 1 hour after cleaning before going into the room.  4. Do not sleep on upholstered furniture (eg, couches).  5. If possible removing carpeting, upholstered furniture and drapery from the home is ideal. Horizontal blinds should be eliminated in the rooms where the person spends the most time (bedroom, study, television room). Washable vinyl, roller-type shades are optimal.  6. Remove all non-washable stuffed toys from the bedroom. Wash stuffed toys weekly like sheets and blankets above.  7. Reduce indoor humidity to less than 50%. Inexpensive humidity monitors can be purchased at most hardware stores. Do not use a humidifier as can make the problem worse and are not recommended.  Control of Dog or Cat Allergen Avoidance is the best way to manage a dog or cat allergy . If you have a dog or cat and are allergic to dog or cats, consider removing the dog or cat from the home. If you have a dog or cat but don't want to find it a new home, or if your family wants a pet even though someone in the household is allergic, here are some strategies that may help keep symptoms at bay:  Keep the pet out of your bedroom and restrict it to only a few rooms. Be advised that keeping the dog or cat in only one room will not limit the allergens to that room. Don't pet, hug or kiss the dog or cat; if you do, wash your hands with soap and water. High-efficiency particulate air (HEPA) cleaners run continuously in a bedroom or living room can reduce allergen levels over time. Regular use of a  high-efficiency vacuum cleaner or a central vacuum can reduce allergen levels. Giving your dog or cat a bath at least once a week can reduce airborne allergen.

## 2023-12-31 ENCOUNTER — Ambulatory Visit (INDEPENDENT_AMBULATORY_CARE_PROVIDER_SITE_OTHER): Admitting: Family Medicine

## 2023-12-31 ENCOUNTER — Encounter: Payer: Self-pay | Admitting: Family Medicine

## 2023-12-31 ENCOUNTER — Other Ambulatory Visit: Payer: Self-pay

## 2023-12-31 VITALS — BP 118/64 | HR 99 | Temp 98.1°F | Resp 18

## 2023-12-31 DIAGNOSIS — H1013 Acute atopic conjunctivitis, bilateral: Secondary | ICD-10-CM

## 2023-12-31 DIAGNOSIS — J302 Other seasonal allergic rhinitis: Secondary | ICD-10-CM | POA: Diagnosis not present

## 2023-12-31 DIAGNOSIS — H1010 Acute atopic conjunctivitis, unspecified eye: Secondary | ICD-10-CM

## 2023-12-31 DIAGNOSIS — J3089 Other allergic rhinitis: Secondary | ICD-10-CM | POA: Diagnosis not present

## 2023-12-31 DIAGNOSIS — T7800XA Anaphylactic reaction due to unspecified food, initial encounter: Secondary | ICD-10-CM

## 2023-12-31 DIAGNOSIS — T7800XD Anaphylactic reaction due to unspecified food, subsequent encounter: Secondary | ICD-10-CM | POA: Diagnosis not present

## 2024-02-25 ENCOUNTER — Ambulatory Visit: Admitting: Allergy & Immunology

## 2025-01-02 ENCOUNTER — Ambulatory Visit: Admitting: Family Medicine
